# Patient Record
Sex: Female | Born: 1980 | Hispanic: No | Marital: Married | Smoking: Never smoker
Health system: Southern US, Community
[De-identification: ages and names within clinical notes are randomized; demographics above are authoritative.]

## PROBLEM LIST (undated history)

## (undated) ENCOUNTER — Ambulatory Visit (HOSPITAL_COMMUNITY): Discharge status: Absent without leave | Payer: BC Managed Care – PPO

## (undated) DIAGNOSIS — B009 Herpesviral infection, unspecified: Secondary | ICD-10-CM

## (undated) HISTORY — PX: WISDOM TOOTH EXTRACTION: SHX21

---

## 2006-02-28 ENCOUNTER — Other Ambulatory Visit: Admission: RE | Admit: 2006-02-28 | Discharge: 2006-02-28 | Payer: Self-pay | Admitting: Family Medicine

## 2007-05-09 ENCOUNTER — Other Ambulatory Visit: Admission: RE | Admit: 2007-05-09 | Discharge: 2007-05-09 | Payer: Self-pay | Admitting: Family Medicine

## 2008-01-16 ENCOUNTER — Other Ambulatory Visit: Admission: RE | Admit: 2008-01-16 | Discharge: 2008-01-16 | Payer: Self-pay | Admitting: Obstetrics and Gynecology

## 2008-05-22 ENCOUNTER — Other Ambulatory Visit: Admission: RE | Admit: 2008-05-22 | Discharge: 2008-05-22 | Payer: Self-pay | Admitting: Obstetrics and Gynecology

## 2009-05-24 ENCOUNTER — Other Ambulatory Visit: Admission: RE | Admit: 2009-05-24 | Discharge: 2009-05-24 | Payer: Self-pay | Admitting: Obstetrics and Gynecology

## 2009-12-09 ENCOUNTER — Other Ambulatory Visit: Admission: RE | Admit: 2009-12-09 | Discharge: 2009-12-09 | Payer: Self-pay | Admitting: Obstetrics and Gynecology

## 2010-03-07 ENCOUNTER — Other Ambulatory Visit: Admission: RE | Admit: 2010-03-07 | Discharge: 2010-03-07 | Payer: Self-pay | Admitting: Obstetrics and Gynecology

## 2010-09-06 ENCOUNTER — Other Ambulatory Visit: Admission: RE | Admit: 2010-09-06 | Discharge: 2010-09-06 | Payer: Self-pay | Admitting: Obstetrics and Gynecology

## 2011-09-19 ENCOUNTER — Other Ambulatory Visit (HOSPITAL_COMMUNITY)
Admission: RE | Admit: 2011-09-19 | Discharge: 2011-09-19 | Disposition: A | Discharge status: Home / Self Care | Payer: BC Managed Care – PPO | Source: Ambulatory Visit | Attending: Obstetrics and Gynecology | Admitting: Obstetrics and Gynecology

## 2011-09-19 ENCOUNTER — Other Ambulatory Visit: Payer: Self-pay | Admitting: Nurse Practitioner

## 2011-09-19 DIAGNOSIS — Z113 Encounter for screening for infections with a predominantly sexual mode of transmission: Secondary | ICD-10-CM | POA: Insufficient documentation

## 2011-09-19 DIAGNOSIS — Z01419 Encounter for gynecological examination (general) (routine) without abnormal findings: Secondary | ICD-10-CM | POA: Insufficient documentation

## 2012-09-19 ENCOUNTER — Other Ambulatory Visit: Payer: Self-pay | Admitting: Nurse Practitioner

## 2012-09-19 ENCOUNTER — Other Ambulatory Visit (HOSPITAL_COMMUNITY)
Admission: RE | Admit: 2012-09-19 | Discharge: 2012-09-19 | Disposition: A | Discharge status: Home / Self Care | Payer: BC Managed Care – PPO | Source: Ambulatory Visit | Attending: Obstetrics and Gynecology | Admitting: Obstetrics and Gynecology

## 2012-09-19 DIAGNOSIS — Z113 Encounter for screening for infections with a predominantly sexual mode of transmission: Secondary | ICD-10-CM | POA: Insufficient documentation

## 2012-09-19 DIAGNOSIS — Z01419 Encounter for gynecological examination (general) (routine) without abnormal findings: Secondary | ICD-10-CM | POA: Insufficient documentation

## 2013-09-25 ENCOUNTER — Other Ambulatory Visit: Payer: Self-pay | Admitting: Nurse Practitioner

## 2013-09-25 ENCOUNTER — Other Ambulatory Visit (HOSPITAL_COMMUNITY)
Admission: RE | Admit: 2013-09-25 | Discharge: 2013-09-25 | Disposition: A | Discharge status: Home / Self Care | Payer: BC Managed Care – PPO | Source: Ambulatory Visit | Attending: Nurse Practitioner | Admitting: Nurse Practitioner

## 2013-09-25 DIAGNOSIS — Z1151 Encounter for screening for human papillomavirus (HPV): Secondary | ICD-10-CM | POA: Insufficient documentation

## 2013-09-25 DIAGNOSIS — R87810 Cervical high risk human papillomavirus (HPV) DNA test positive: Secondary | ICD-10-CM | POA: Insufficient documentation

## 2013-09-25 DIAGNOSIS — Z01419 Encounter for gynecological examination (general) (routine) without abnormal findings: Secondary | ICD-10-CM | POA: Insufficient documentation

## 2014-03-26 ENCOUNTER — Other Ambulatory Visit: Payer: Self-pay | Admitting: Nurse Practitioner

## 2014-03-26 ENCOUNTER — Other Ambulatory Visit (HOSPITAL_COMMUNITY)
Admission: RE | Admit: 2014-03-26 | Discharge: 2014-03-26 | Disposition: A | Discharge status: Home / Self Care | Payer: BC Managed Care – PPO | Source: Ambulatory Visit | Attending: Obstetrics and Gynecology | Admitting: Obstetrics and Gynecology

## 2014-03-26 DIAGNOSIS — Z01419 Encounter for gynecological examination (general) (routine) without abnormal findings: Secondary | ICD-10-CM | POA: Insufficient documentation

## 2014-04-27 ENCOUNTER — Encounter: Payer: Self-pay | Admitting: Family Medicine

## 2014-09-24 ENCOUNTER — Other Ambulatory Visit: Payer: Self-pay | Admitting: Nurse Practitioner

## 2014-09-24 ENCOUNTER — Other Ambulatory Visit (HOSPITAL_COMMUNITY)
Admission: RE | Admit: 2014-09-24 | Discharge: 2014-09-24 | Disposition: A | Discharge status: Home / Self Care | Payer: BC Managed Care – PPO | Source: Ambulatory Visit | Attending: Obstetrics and Gynecology | Admitting: Obstetrics and Gynecology

## 2014-09-24 DIAGNOSIS — Z01419 Encounter for gynecological examination (general) (routine) without abnormal findings: Secondary | ICD-10-CM | POA: Diagnosis present

## 2014-09-25 LAB — CYTOLOGY - PAP

## 2014-11-27 NOTE — L&D Delivery Note (Signed)
Delivery Note At 5:46 PM a viable female was delivered via Vaginal, Spontaneous Delivery (Presentation: Middle Occiput Anterior).  APGAR: 8, 9; weight  .   Placenta status: Intact, Spontaneous.  Cord: 3 vessels with the following complications: None.  Cord pH: NA  Anesthesia: Epidural  Episiotomy: None Lacerations: 2nd degree;Perineal Suture Repair: 3.0 vicryl Est. Blood Loss (mL): 400  Mom to postpartum.  Baby to Couplet care / Skin to Skin.  Nykeem Citro J. 11/20/2015, 6:57 PM

## 2015-01-05 ENCOUNTER — Ambulatory Visit (INDEPENDENT_AMBULATORY_CARE_PROVIDER_SITE_OTHER): Payer: BC Managed Care – PPO

## 2015-01-05 ENCOUNTER — Ambulatory Visit (INDEPENDENT_AMBULATORY_CARE_PROVIDER_SITE_OTHER): Payer: BC Managed Care – PPO | Admitting: Podiatry

## 2015-01-05 ENCOUNTER — Encounter: Payer: Self-pay | Admitting: Podiatry

## 2015-01-05 VITALS — BP 104/64 | HR 87 | Resp 16 | Ht 62.0 in | Wt 137.0 lb

## 2015-01-05 DIAGNOSIS — M2012 Hallux valgus (acquired), left foot: Secondary | ICD-10-CM

## 2015-01-05 DIAGNOSIS — M21612 Bunion of left foot: Secondary | ICD-10-CM

## 2015-01-05 NOTE — Progress Notes (Signed)
   Subjective:    Patient ID: Caroline Payne, female    DOB: 08/30/1981, 34 y.o.   MRN: [id number]  HPI Comments: Left foot i think i have a bunion . Felt it more after wearing high heels, it has got a little worse. The pain has subsided some ,but i have paid more attention to the shoes i wear      Review of Systems  All other systems reviewed and are negative.      Objective:   Physical Exam: I have reviewed his past medical history medications allergy surgery social history and review of systems. Pulses are strongly palpable bilateral. Neurologic sensorium is intact for Semmes-Weinstein monofilament. Deep tendon reflexes intact bilateral and muscle strength was 5 over 5 dorsiflexion splint flexors and everters and inverters all intrinsic musculature is intact. Orthopedic evaluation of his result joints distal to the ankle for range of motion without crepitus with exception of the first metatarsophalangeal joint of the left foot which somewhat limited on dorsiflexion. Radiographic evaluation of the bilateral foot does demonstrate an increase in the first intermetatarsal angle left over right and an increase in hallux adductus angle greater than normal values. Just has a hammertoe deformity left.        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  Plan: Discussed in great detail today the need for surgical intervention. She understands that this is more than likely an inherited condition particularly since it is unilateral.

## 2015-11-19 ENCOUNTER — Other Ambulatory Visit: Payer: Self-pay | Admitting: Obstetrics & Gynecology

## 2015-11-20 ENCOUNTER — Inpatient Hospital Stay (HOSPITAL_COMMUNITY)
Admission: AD | Admit: 2015-11-20 | Discharge: 2015-11-22 | DRG: 774 | Disposition: A | Discharge status: Home / Self Care | Payer: BC Managed Care – PPO | Source: Ambulatory Visit | Attending: Obstetrics and Gynecology | Admitting: Obstetrics and Gynecology

## 2015-11-20 ENCOUNTER — Inpatient Hospital Stay (HOSPITAL_COMMUNITY): Payer: BC Managed Care – PPO | Admitting: Anesthesiology

## 2015-11-20 ENCOUNTER — Encounter (HOSPITAL_COMMUNITY): Payer: Self-pay

## 2015-11-20 DIAGNOSIS — Z6831 Body mass index (BMI) 31.0-31.9, adult: Secondary | ICD-10-CM

## 2015-11-20 DIAGNOSIS — E669 Obesity, unspecified: Secondary | ICD-10-CM | POA: Diagnosis present

## 2015-11-20 DIAGNOSIS — B009 Herpesviral infection, unspecified: Secondary | ICD-10-CM | POA: Diagnosis present

## 2015-11-20 DIAGNOSIS — O9852 Other viral diseases complicating childbirth: Secondary | ICD-10-CM | POA: Diagnosis present

## 2015-11-20 DIAGNOSIS — O99214 Obesity complicating childbirth: Secondary | ICD-10-CM | POA: Diagnosis present

## 2015-11-20 DIAGNOSIS — IMO0001 Reserved for inherently not codable concepts without codable children: Secondary | ICD-10-CM

## 2015-11-20 DIAGNOSIS — Z3A39 39 weeks gestation of pregnancy: Secondary | ICD-10-CM | POA: Diagnosis not present

## 2015-11-20 HISTORY — DX: Herpesviral infection, unspecified: B00.9

## 2015-11-20 LAB — ABO/RH: ABO/RH(D): O POS

## 2015-11-20 LAB — OB RESULTS CONSOLE RPR: RPR: NONREACTIVE

## 2015-11-20 LAB — OB RESULTS CONSOLE GBS: STREP GROUP B AG: NEGATIVE

## 2015-11-20 LAB — CBC
HEMATOCRIT: 38.2 % (ref 36.0–46.0)
Hemoglobin: 13.4 g/dL (ref 12.0–15.0)
MCH: 32.2 pg (ref 26.0–34.0)
MCHC: 35.1 g/dL (ref 30.0–36.0)
MCV: 91.8 fL (ref 78.0–100.0)
PLATELETS: 199 10*3/uL (ref 150–400)
RBC: 4.16 MIL/uL (ref 3.87–5.11)
RDW: 12.9 % (ref 11.5–15.5)
WBC: 18.7 10*3/uL — AB (ref 4.0–10.5)

## 2015-11-20 LAB — OB RESULTS CONSOLE GC/CHLAMYDIA
Chlamydia: NEGATIVE
Gonorrhea: NEGATIVE

## 2015-11-20 LAB — TYPE AND SCREEN
ABO/RH(D): O POS
Antibody Screen: NEGATIVE

## 2015-11-20 LAB — OB RESULTS CONSOLE HIV ANTIBODY (ROUTINE TESTING): HIV: NONREACTIVE

## 2015-11-20 MED ORDER — OXYTOCIN 40 UNITS IN LACTATED RINGERS INFUSION - SIMPLE MED
1.0000 m[IU]/min | INTRAVENOUS | Status: DC
  Administered 2015-11-20: 2 m[IU]/min via INTRAVENOUS
  Filled 2015-11-20: qty 1000

## 2015-11-20 MED ORDER — BENZOCAINE-MENTHOL 20-0.5 % EX AERO
1.0000 "application " | INHALATION_SPRAY | CUTANEOUS | Status: DC | PRN
  Administered 2015-11-20 – 2015-11-22 (×2): 1 via TOPICAL
  Filled 2015-11-20 (×2): qty 56

## 2015-11-20 MED ORDER — ONDANSETRON HCL 4 MG/2ML IJ SOLN: 4.0000 mg | INTRAMUSCULAR | Status: DC | PRN

## 2015-11-20 MED ORDER — LANOLIN HYDROUS EX OINT: TOPICAL_OINTMENT | CUTANEOUS | Status: DC | PRN

## 2015-11-20 MED ORDER — ONDANSETRON HCL 4 MG/2ML IJ SOLN
4.0000 mg | Freq: Four times a day (QID) | INTRAMUSCULAR | Status: DC | PRN
  Filled 2015-11-20: qty 2

## 2015-11-20 MED ORDER — OXYTOCIN 40 UNITS IN LACTATED RINGERS INFUSION - SIMPLE MED
62.5000 mL/h | INTRAVENOUS | Status: DC
  Administered 2015-11-20: 62.5 mL/h via INTRAVENOUS

## 2015-11-20 MED ORDER — FENTANYL 2.5 MCG/ML BUPIVACAINE 1/10 % EPIDURAL INFUSION (WH - ANES)
14.0000 mL/h | INTRAMUSCULAR | Status: DC | PRN
  Administered 2015-11-20: 14 mL/h via EPIDURAL
  Filled 2015-11-20: qty 125

## 2015-11-20 MED ORDER — METHYLERGONOVINE MALEATE 0.2 MG PO TABS: 0.2000 mg | ORAL_TABLET | ORAL | Status: DC | PRN

## 2015-11-20 MED ORDER — ZOLPIDEM TARTRATE 5 MG PO TABS: 5.0000 mg | ORAL_TABLET | Freq: Every evening | ORAL | Status: DC | PRN

## 2015-11-20 MED ORDER — FLEET ENEMA 7-19 GM/118ML RE ENEM
1.0000 | ENEMA | RECTAL | Status: DC | PRN
Start: 2015-11-20 — End: 2015-11-20

## 2015-11-20 MED ORDER — PRENATAL MULTIVITAMIN CH
1.0000 | ORAL_TABLET | Freq: Every day | ORAL | Status: DC
  Administered 2015-11-21: 1 via ORAL
  Filled 2015-11-20: qty 1

## 2015-11-20 MED ORDER — CITRIC ACID-SODIUM CITRATE 334-500 MG/5ML PO SOLN: 30.0000 mL | ORAL | Status: DC | PRN

## 2015-11-20 MED ORDER — PHENYLEPHRINE 40 MCG/ML (10ML) SYRINGE FOR IV PUSH (FOR BLOOD PRESSURE SUPPORT)
80.0000 ug | PREFILLED_SYRINGE | INTRAVENOUS | Status: DC | PRN
  Filled 2015-11-20: qty 20
  Filled 2015-11-20: qty 2

## 2015-11-20 MED ORDER — OXYTOCIN BOLUS FROM INFUSION
500.0000 mL | INTRAVENOUS | Status: DC
  Administered 2015-11-20: 500 mL via INTRAVENOUS

## 2015-11-20 MED ORDER — SIMETHICONE 80 MG PO CHEW: 80.0000 mg | CHEWABLE_TABLET | ORAL | Status: DC | PRN

## 2015-11-20 MED ORDER — TERBUTALINE SULFATE 1 MG/ML IJ SOLN
0.2500 mg | Freq: Once | INTRAMUSCULAR | Status: DC | PRN
  Filled 2015-11-20: qty 1

## 2015-11-20 MED ORDER — OXYCODONE-ACETAMINOPHEN 5-325 MG PO TABS: 2.0000 | ORAL_TABLET | ORAL | Status: DC | PRN

## 2015-11-20 MED ORDER — SENNOSIDES-DOCUSATE SODIUM 8.6-50 MG PO TABS
2.0000 | ORAL_TABLET | ORAL | Status: DC
  Administered 2015-11-21: 2 via ORAL
  Filled 2015-11-20 (×2): qty 2

## 2015-11-20 MED ORDER — DIPHENHYDRAMINE HCL 50 MG/ML IJ SOLN: 12.5000 mg | INTRAMUSCULAR | Status: DC | PRN

## 2015-11-20 MED ORDER — LACTATED RINGERS IV SOLN
500.0000 mL | INTRAVENOUS | Status: DC | PRN
  Administered 2015-11-20 (×2): 500 mL via INTRAVENOUS

## 2015-11-20 MED ORDER — LIDOCAINE HCL (PF) 1 % IJ SOLN
30.0000 mL | INTRAMUSCULAR | Status: DC | PRN
  Filled 2015-11-20: qty 30

## 2015-11-20 MED ORDER — EPHEDRINE 5 MG/ML INJ
10.0000 mg | INTRAVENOUS | Status: DC | PRN
  Filled 2015-11-20: qty 2

## 2015-11-20 MED ORDER — LACTATED RINGERS IV SOLN
INTRAVENOUS | Status: DC
  Administered 2015-11-20 (×4): via INTRAVENOUS

## 2015-11-20 MED ORDER — OXYCODONE-ACETAMINOPHEN 5-325 MG PO TABS: 1.0000 | ORAL_TABLET | ORAL | Status: DC | PRN

## 2015-11-20 MED ORDER — ACETAMINOPHEN 325 MG PO TABS: 650.0000 mg | ORAL_TABLET | ORAL | Status: DC | PRN

## 2015-11-20 MED ORDER — METHYLERGONOVINE MALEATE 0.2 MG/ML IJ SOLN: 0.2000 mg | INTRAMUSCULAR | Status: DC | PRN

## 2015-11-20 MED ORDER — ONDANSETRON HCL 4 MG PO TABS: 4.0000 mg | ORAL_TABLET | ORAL | Status: DC | PRN

## 2015-11-20 MED ORDER — LIDOCAINE HCL (PF) 1 % IJ SOLN
INTRAMUSCULAR | Status: DC | PRN
  Administered 2015-11-20: 3 mL via EPIDURAL
  Administered 2015-11-20: 5 mL via EPIDURAL
  Administered 2015-11-20: 2 mL via EPIDURAL

## 2015-11-20 MED ORDER — DIBUCAINE 1 % RE OINT: 1.0000 "application " | TOPICAL_OINTMENT | RECTAL | Status: DC | PRN

## 2015-11-20 MED ORDER — IBUPROFEN 600 MG PO TABS
600.0000 mg | ORAL_TABLET | Freq: Four times a day (QID) | ORAL | Status: DC
  Administered 2015-11-21 – 2015-11-22 (×5): 600 mg via ORAL
  Filled 2015-11-20 (×6): qty 1

## 2015-11-20 MED ORDER — WITCH HAZEL-GLYCERIN EX PADS
1.0000 | MEDICATED_PAD | CUTANEOUS | Status: DC | PRN
Start: 2015-11-20 — End: 2015-11-22

## 2015-11-20 MED ORDER — DIPHENHYDRAMINE HCL 25 MG PO CAPS: 25.0000 mg | ORAL_CAPSULE | Freq: Four times a day (QID) | ORAL | Status: DC | PRN

## 2015-11-20 MED ORDER — FERROUS SULFATE 325 (65 FE) MG PO TABS
325.0000 mg | ORAL_TABLET | Freq: Two times a day (BID) | ORAL | Status: DC
  Administered 2015-11-22: 325 mg via ORAL
  Filled 2015-11-20: qty 1

## 2015-11-20 NOTE — H&P (Signed)
Caroline Payne is a 34 y.o. female  G1P0 at 50 wks and 6 days with EDD 11/21/2015 presents complaining of regular contractions. Every 2-4 minutes. She was 2 cm on arrival to MAU. She ambulated for an hour and progressed to 3.5 cm. + FM  Patients thinks her membranes ruptured at 830 am.  No vaginal bleeding. 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 She has been on valtrex. She denies any active lesions. She recieves Prenatal care with DR. Ozan at [facility].     History OB History    Gravida Para Term Preterm AB TAB SAB Ectopic Multiple Living   1              Past Medical History  Diagnosis Date  . HSV infection    Past Surgical History  Procedure Laterality Date  . Wisdom tooth extraction     Family History: family history is not on file. Social History:  reports that she has never smoked. She has never used smokeless tobacco. She reports that she does not drink alcohol or use illicit drugs.   Prenatal Transfer Tool  Maternal Diabetes: No Genetic Screening: Normal Maternal Ultrasounds/Referrals: Normal Fetal Ultrasounds or other Referrals:  None Maternal Substance Abuse:  No Significant Maternal Medications:  None Significant Maternal Lab Results:  Lab values include: Group B Strep negative Other Comments:  None  Review of Systems  Constitutional: Negative.   HENT: Negative.   Eyes: Negative.   Respiratory: Negative.   Cardiovascular: Negative.   Gastrointestinal: Negative.   Genitourinary: Negative.   Musculoskeletal: Negative.   Skin: Negative.   Neurological: Negative.   Endo/Heme/Allergies: Negative.   Psychiatric/Behavioral: Negative.     Dilation: 3.5 Effacement (%): 100 Station: -2 Exam by:: [employer] Blood pressure 176/150, pulse 86, temperature 98.5 F (36.9 C), temperature source Oral, resp. rate 20, height  (1.575 m), weight 173 lb (78.472 kg), SpO2 100 %. Maternal Exam:  Uterine Assessment: Contraction strength is moderate.   Contraction duration is 60 seconds. Contraction frequency is regular.   Abdomen: Patient reports no abdominal tenderness. Fetal presentation: vertex  Introitus: Normal vulva. Normal vagina.    Fetal Exam Fetal Monitor Review: Baseline rate: 120.  Variability: moderate (6-25 bpm).   Pattern: accelerations present and no decelerations.    Fetal State Assessment: Category I - tracings are normal.     Physical Exam  Vitals reviewed. Constitutional: She is oriented to person, place, and time. She appears well-developed and well-nourished.  HENT:  Head: Normocephalic and atraumatic.  Eyes: Pupils are equal, round, and reactive to light.  Neck: Normal range of motion.  Cardiovascular: Normal rate and regular rhythm.   Respiratory: Effort normal and breath sounds normal.  GI: Bowel sounds are normal. There is no tenderness. There is no rebound.  Genitourinary: Vagina normal.  Musculoskeletal: Normal range of motion. She exhibits no edema.  Neurological: She is alert and oriented to person, place, and time. She has normal reflexes.  Skin: Skin is warm and dry.  Psychiatric: She has a normal mood and affect.   Cervix 3.5 cm / 100/ -2 station... IUPC placed. Forebag broken  Fluid appears clear   Prenatal labs: ABO, Rh: --/--/O POS (12/24 0745) Antibody: NEG (12/24 0745) Rubella:  Immune RPR:   Nonreactive  HBsAg:   Negative  HIV:   Non-reactive  GBS:   Negative   Assessment/Plan: 39 wks and 6 days in Labor.  Admit to birthing suites  GBS negative  HSV infection... No  active lesions  Anticipate SVD   Shawndale Kilpatrick J. 11/20/2015, 9:43 AM

## 2015-11-20 NOTE — Anesthesia Procedure Notes (Signed)
Epidural Patient location during procedure: OB  Staffing Anesthesiologist: Parminder Trapani EDWARD Performed by: anesthesiologist   Preanesthetic Checklist Completed: patient identified, pre-op evaluation, timeout performed, IV checked, risks and benefits discussed and monitors and equipment checked  Epidural Patient position: sitting Prep: DuraPrep Patient monitoring: blood pressure and continuous pulse ox Approach: midline Location: L3-L4 Injection technique: LOR air  Needle:  Needle type: Tuohy  Needle gauge: 17 G Needle length: 9 cm Needle insertion depth: 5 cm Catheter size: 19 Gauge Catheter at skin depth: 10 cm Test dose: negative and Other (1% Lidocaine)  Additional Notes Patient identified.  Risk benefits discussed including failed block, incomplete pain control, headache, nerve damage, paralysis, blood pressure changes, nausea, vomiting, reactions to medication both toxic or allergic, and postpartum back pain.  Patient expressed understanding and wished to proceed.  All questions were answered.  Sterile technique used throughout procedure and epidural site dressed with sterile barrier dressing. No paresthesia or other complications noted. The patient did not experience any signs of intravascular injection such as tinnitus or metallic taste in mouth nor signs of intrathecal spread such as rapid motor block. Please see nursing notes for vital signs. Reason for block:procedure for pain   

## 2015-11-20 NOTE — MAU Note (Addendum)
Sweeped membranes in office 12/23, was 1 cm.  Spotting earlier.  Mucus. No leaking.  Baby moving well. Ctxs since 10 pm.

## 2015-11-20 NOTE — Progress Notes (Signed)
Caroline LangeChristina Payne is a 34 y.o. G1P0 at 2029w6d admitted for active labor  Subjective:  Patient is comfortable with her epidural. No complaints. Feeling more pressure  Objective: BP 105/58 mmHg  Pulse 96  Temp(Src) 97.8 F (36.6 C) (Oral)  Resp 18  Ht 5\' 2"  (1.575 m)  Wt 173 lb (78.472 kg)  BMI 31.63 kg/m2  SpO2 98%   Total I/O In: -  Out: 600 [Urine:600]  FHT:  FHR: 130 bpm, variability: moderate,  accelerations:  Present,  decelerations:  Present 1 isolated variable  to 90 resolved with repositioning.Marland Kitchen. overall UC:   regular, every 2-3 minutes SVE:  8/100-1 station  Labs: Lab Results  Component Value Date   WBC 18.7* 11/20/2015   HGB 13.4 11/20/2015   HCT 38.2 11/20/2015   MCV 91.8 11/20/2015   PLT 199 11/20/2015    Assessment / Plan: Augmentation of labor, progressing well  Labor: progressing on pitocin  Preeclampsia:  NA Fetal Wellbeing:  Category I Pain Control:  Epidural I/D:  n/a Anticipated MOD:  NSVD  Caroline Payne J. 11/20/2015, 2:32 PM

## 2015-11-20 NOTE — Lactation Note (Signed)
This note was copied from the chart of Caroline Sallee LangeChristina Donica. Lactation Consultation Note  Patient Name: Caroline Payne ZOXWR'UToday's Date: 11/20/2015 Reason for consult: Initial assessment Baby at 5 hr of life and mom reports feeding are going well. FOB was sts with baby, visitors present, mom refused latch help at this time. Encouraged her to call out if she had any issues. Answered questions about pumping and formula use. Discussed baby behavior, feeding frequency, baby belly size, voids, wt loss, breast changes, and nipple care. Mom stated that she can manually express and has seen colostrum bilaterally. Given lactation handouts. Aware of OP services and support group.    Maternal Data Has patient been taught Hand Expression?: Yes Does the patient have breastfeeding experience prior to this delivery?: No  Feeding Feeding Type: Breast Fed Length of feed: 10 min  LATCH Score/Interventions                      Lactation Tools Discussed/Used     Consult Status Consult Status: Follow-up Date: 11/21/15 Follow-up type: In-patient    Rulon Eisenmengerlizabeth E Kamarri Fischetti 11/20/2015, 11:01 PM

## 2015-11-20 NOTE — Anesthesia Preprocedure Evaluation (Signed)
Anesthesia Evaluation  Patient identified by MRN, date of birth, ID band Patient awake    Reviewed: Allergy & Precautions, NPO status , Patient's Chart, lab work & pertinent test results  Airway Mallampati: III  TM Distance: >3 FB Neck ROM: Full    Dental  (+) Teeth Intact, Dental Advisory Given   Pulmonary neg pulmonary ROS,    Pulmonary exam normal breath sounds clear to auscultation       Cardiovascular Exercise Tolerance: Good negative cardio ROS Normal cardiovascular exam Rhythm:Regular Rate:Normal     Neuro/Psych negative neurological ROS  negative psych ROS   GI/Hepatic negative GI ROS, Neg liver ROS,   Endo/Other  Obesity   Renal/GU negative Renal ROS     Musculoskeletal negative musculoskeletal ROS (+)   Abdominal   Peds  Hematology negative hematology ROS (+) Plt 199k   Anesthesia Other Findings Day of surgery medications reviewed with the patient.  Reproductive/Obstetrics (+) Pregnancy                             Anesthesia Physical Anesthesia Plan  ASA: II  Anesthesia Plan: Epidural   Post-op Pain Management:    Induction:   Airway Management Planned:   Additional Equipment:   Intra-op Plan:   Post-operative Plan:   Informed Consent: I have reviewed the patients History and Physical, chart, labs and discussed the procedure including the risks, benefits and alternatives for the proposed anesthesia with the patient or authorized representative who has indicated his/her understanding and acceptance.   Dental advisory given  Plan Discussed with:   Anesthesia Plan Comments: (Patient identified. Risks/Benefits/Options discussed with patient including but not limited to bleeding, infection, nerve damage, paralysis, failed block, incomplete pain control, headache, blood pressure changes, nausea, vomiting, reactions to medication both or allergic, itching and  postpartum back pain. Confirmed with bedside nurse the patient's most recent platelet count. Confirmed with patient that they are not currently taking any anticoagulation, have any bleeding history or any family history of bleeding disorders. Patient expressed understanding and wished to proceed. All questions were answered. )        Anesthesia Quick Evaluation

## 2015-11-21 DIAGNOSIS — IMO0001 Reserved for inherently not codable concepts without codable children: Secondary | ICD-10-CM

## 2015-11-21 LAB — CBC
HCT: 32.1 % — ABNORMAL LOW (ref 36.0–46.0)
HEMOGLOBIN: 11.2 g/dL — AB (ref 12.0–15.0)
MCH: 32.1 pg (ref 26.0–34.0)
MCHC: 34.9 g/dL (ref 30.0–36.0)
MCV: 92 fL (ref 78.0–100.0)
Platelets: 183 10*3/uL (ref 150–400)
RBC: 3.49 MIL/uL — AB (ref 3.87–5.11)
RDW: 13.5 % (ref 11.5–15.5)
WBC: 24.1 10*3/uL — ABNORMAL HIGH (ref 4.0–10.5)

## 2015-11-21 LAB — RPR: RPR: NONREACTIVE

## 2015-11-21 MED ORDER — IBUPROFEN 600 MG PO TABS: 600.0000 mg | ORAL_TABLET | Freq: Four times a day (QID) | ORAL | Status: DC | PRN

## 2015-11-21 NOTE — Lactation Note (Signed)
This note was copied from the chart of Caroline Sallee LangeChristina Lagace. Lactation Consultation Note  Patient Name: Caroline Payne RUEAV'WToday's Date: 11/21/2015 Reason for consult: Follow-up assessment;Difficult latch Visited with Mom, baby at 3821 hrs old and hasn't been able to sustain a deep latch.  Baby circ'd this am and has been swaddled and sleeping.  Offered to assist with trying to latch baby or place him skin to skin.  Baby easily awakened when undressed.  Sucking on finger, tongue noted to be pushing finger out of mouth.  Unable to see a frenulum, but did feel one on finger swipe under tongue.  Set baby up skin to skin in football hold on right side.  Assisted Mom to manually express her breast, small dot of colostrum expressed.  Breasts soft, and areola slightly full, but are compressible.  Demonstrated hand placement and how to elicit baby to open his mouth.  Mom trying to force baby onto breast without his mouth opening.  Baby did open a couple times, but was unable to sustain a deep areolar grasp.  Baby suckling on nipple only, a couple times before falling asleep.  Mom denied feeling discomfort.  Recommended she use manual breast expression, massage and use of DEBP for 15 mins each time baby is unable to latch deeply.  Talked about possibly using a nipple shield when baby becomes more hungry.  Talked about using expressed colostrum by syringe and/or Alimentum formula to supplement by cup/bottle.  Mom agreeable to supplementing, but she is teary eyed.  Reassurance given that we would work with her to reach her breastfeeding goals. To follow up prn, and in am.     Consult Status Consult Status: Follow-up Date: 11/22/15 Follow-up type: In-patient    Judee ClaraSmith, Ronney Honeywell E 11/21/2015, 3:25 PM

## 2015-11-21 NOTE — Progress Notes (Signed)
Post Partum Day 1 Subjective: no complaints, up ad lib, voiding and tolerating PO  Objective: Blood pressure 105/74, pulse 88, temperature 98.4 F (36.9 C), temperature source Oral, resp. rate 20, height 5\' 2"  (1.575 m), weight 173 lb (78.472 kg), SpO2 100 %, unknown if currently breastfeeding.  Physical Exam:  General: alert and cooperative Lochia: appropriate Uterine Fundus: firm Incision: NA DVT Evaluation: No evidence of DVT seen on physical exam.   Recent Labs  11/20/15 0745 11/21/15 0535  HGB 13.4 11.2*  HCT 38.2 32.1*    Assessment/Plan: Plan for discharge tomorrow, Breastfeeding and Circumcision prior to discharge  Routine postpartum care    LOS: 1 day   Faye Strohman J. 11/21/2015, 11:41 AM

## 2015-11-21 NOTE — Lactation Note (Signed)
This note was copied from the chart of Caroline Sallee LangeChristina Burrous. Lactation Consultation Note  Set up DEBP for stimulation.  Reviewed cleaning and milk storage. Mother was able to pump some drops of colostrum. Reviewed how to spoon feed, cup feed and syringe feed. Suggest mother post pump 4-6 times a day if baby until baby is more successful at breastfeeding. Left LC phone number and suggest parents call to help with next feeding. Baby has been sleepy due to circ.  [facility].  Clydie BraunKaren RN assisting w/ hand expression upon entering and then helped latch. Baby came off breast after a few minutes of feeding.  Sucks and swallows observed. Assisted w/ relatching in football hold on right breast.  Baby tongue thrusts and has short lingual frenulum.  Advised parents to speak w/ Ped MD. Demonstrated to do suck training. Mother needed help with positioning and praised her for her and dad's efforts. Helped w/ repositioning in cross cradle and football on left breast. Mother had recently pumped approx 5 ml of colostrum.   Encouraged her to breastfeed on both breasts per feeding, flange lips and massage breast.  Demonstrated how to finger syringe feed colostrum to baby.   Baby does not keep suction during entire finger feed.  Suggest mother continue to post pump at least one more time tonight.    Patient Name: Caroline Payne XBJYN'WToday's Date: 11/21/2015 Reason for consult: Follow-up assessment;Difficult latch   Maternal Data    Feeding Feeding Type: Breast Fed Length of feed: 15 min (on and off)  LATCH Score/Interventions Latch: Repeated attempts needed to sustain latch, nipple held in mouth throughout feeding, stimulation needed to elicit sucking reflex. Intervention(s): Skin to skin;Teach feeding cues;Waking techniques Intervention(s): Breast compression;Breast massage;Assist with latch;Adjust position  Audible Swallowing: None Intervention(s): Hand expression;Skin to  skin Intervention(s): Skin to skin;Hand expression;Alternate breast massage  Type of Nipple: Everted at rest and after stimulation  Comfort (Breast/Nipple): Soft / non-tender     Hold (Positioning): Assistance needed to correctly position infant at breast and maintain latch. Intervention(s): Breastfeeding basics reviewed;Support Pillows;Position options;Skin to skin  LATCH Score: 6  Lactation Tools Discussed/Used Tools: Pump Breast pump type: Double-Electric Breast Pump Pump Review: Setup, frequency, and cleaning;Milk Storage Initiated by:: Shaune Pollack. Berklehammer RN IBCLC Date initiated:: 11/21/15   Consult Status Consult Status: Follow-up Date: 11/22/15 Follow-up type: In-patient    Dahlia ByesBerkelhammer, Nahiara Kretzschmar [facility] 11/21/2015, 4:03 PM

## 2015-11-21 NOTE — Anesthesia Postprocedure Evaluation (Signed)
Anesthesia Post Note  Patient: Caroline Payne  Procedure(s) Performed: * No procedures listed *  Patient location during evaluation: Mother Baby Anesthesia Type: Epidural Level of consciousness: awake Pain management: satisfactory to patient Vital Signs Assessment: post-procedure vital signs reviewed and stable Respiratory status: spontaneous breathing Cardiovascular status: stable Anesthetic complications: no    Last Vitals:  Filed Vitals:   11/21/15 0531 11/21/15 0820  BP: 109/68 105/74  Pulse: 84 88  Temp: 36.5 C 36.9 C  Resp: 18 20    Last Pain:  Filed Vitals:   11/21/15 0821  PainSc: 2                  Wilmina Maxham

## 2015-11-22 NOTE — Lactation Note (Signed)
This note was copied from the chart of Caroline Payne Tenorio. Lactation Consultation Note  Follow up visit made prior to discharge.  Mom states most feedings are going better.  Sometimes the baby goes too long between feeds and becomes frantic making the latch more difficult.  Discussed watching for early feeding cues and allowing baby to suck on a clean finger if frantic.  Discharge instructions given including engorgement treatment.  Outpatient lactation services and support information reviewed and encouraged.  Patient Name: Caroline Payne Grega JXBJY'NToday's Date: 11/22/2015     Maternal Data    Feeding Feeding Type: Breast Fed Length of feed: 10 min  LATCH Score/Interventions Latch: Grasps breast easily, tongue down, lips flanged, rhythmical sucking.  Audible Swallowing: A few with stimulation  Type of Nipple: Everted at rest and after stimulation  Comfort (Breast/Nipple): Soft / non-tender     Hold (Positioning): Assistance needed to correctly position infant at breast and maintain latch.  LATCH Score: 8  Lactation Tools Discussed/Used     Consult Status      Huston FoleyMOULDEN, Zaheer Wageman S 11/22/2015, 10:06 AM

## 2015-11-22 NOTE — Discharge Summary (Signed)
OB Discharge Summary     Patient Name: Caroline Payne DOB: 08/08/1981 MRN: [id number]  Date of admission: 11/20/2015 Delivering MD: Gerald LeitzOLE, Fatoumata Albaugh   Date of discharge: 11/22/2015  Admitting diagnosis: 40wks, CTX Active labor  Intrauterine pregnancy: 2933w6d     Secondary diagnosis:  Active Problems:   Indication for care in labor or delivery   Active labor at term  Additional problems: None     Discharge diagnosis: Term Pregnancy Delivered                                                                                                Post partum procedures:None  Augmentation: Pitocin  Complications: None  Hospital course:  Onset of Labor With Vaginal Delivery     34 y.o. yo G1P1001 at 333w6d was admitted in Active Laboron 11/20/2015. Patient had an uncomplicated labor course as follows:  Membrane Rupture Time/Date: 8:35 AM ,11/20/2015   Intrapartum Procedures: Episiotomy: None [1]                                         Lacerations:  2nd degree [3];Perineal [11]  Patient had a delivery of a Viable infant. 11/20/2015  Information for the patient's newborn:  Caroline Payne, Boy Caroline Payne [[id number]][[id number]]  Delivery Method: Vag-Spont    Pateint had an uncomplicated postpartum course.  She is ambulating, tolerating a regular diet, passing flatus, and urinating well. Patient is discharged home in stable condition on No discharge date for patient encounter.Marland Kitchen.    Physical exam  Filed Vitals:   11/21/15 0531 11/21/15 0820 11/21/15 1829 11/22/15 0613  BP: 109/68 105/74 112/70 97/63  Pulse: 84 88 76 72  Temp: 97.7 F (36.5 C) 98.4 F (36.9 C) 98.2 F (36.8 C) 97.8 F (36.6 C)  TempSrc: Oral Oral Oral Oral  Resp: 18 20 18 17   Height:      Weight:      SpO2:       General: alert, cooperative and no distress Lochia: appropriate Uterine Fundus: firm Incision: N/A DVT Evaluation: No evidence of DVT seen on physical exam. Labs: Lab Results  Component Value Date   WBC 24.1* 11/21/2015   HGB  11.2* 11/21/2015   HCT 32.1* 11/21/2015   MCV 92.0 11/21/2015   PLT 183 11/21/2015   No flowsheet data found.  Discharge instruction: per After Visit Summary and "Baby and Me Booklet".  After visit meds:    Medication List    STOP taking these medications        valACYclovir 500 MG tablet  Commonly known as:  VALTREX      TAKE these medications        ibuprofen 600 MG tablet  Commonly known as:  ADVIL,MOTRIN  Take 1 tablet (600 mg total) by mouth every 6 (six) hours as needed.     prenatal multivitamin Tabs tablet  Take 1 tablet by mouth daily at 12 noon.        Diet: routine diet  Activity: Advance as tolerated. Pelvic  rest for 6 weeks.   Outpatient follow up:2 weeks Follow up Appt:Future Appointments Date Time Provider Department Center  11/25/2015 12:00 AM [facility] ROOM [facility] None   Follow up Visit:No Follow-up on file.  Postpartum contraception: Not Discussed  Newborn Data: Live born female  Birth Weight: 7 lb 11 oz (3487 g) APGAR: 8, 9  Baby Feeding: Breast Disposition:home with mother   11/22/2015 Jessee Avers., MD

## 2015-11-25 ENCOUNTER — Inpatient Hospital Stay (HOSPITAL_COMMUNITY): Admission: RE | Admit: 2015-11-25 | Payer: BC Managed Care – PPO | Source: Ambulatory Visit

## 2017-01-19 ENCOUNTER — Other Ambulatory Visit: Payer: Self-pay | Admitting: Obstetrics & Gynecology

## 2017-01-19 ENCOUNTER — Other Ambulatory Visit (HOSPITAL_COMMUNITY)
Admission: RE | Admit: 2017-01-19 | Discharge: 2017-01-19 | Disposition: A | Discharge status: Home / Self Care | Payer: BC Managed Care – PPO | Source: Ambulatory Visit | Attending: Obstetrics & Gynecology | Admitting: Obstetrics & Gynecology

## 2017-01-19 DIAGNOSIS — Z1151 Encounter for screening for human papillomavirus (HPV): Secondary | ICD-10-CM | POA: Diagnosis not present

## 2017-01-19 DIAGNOSIS — Z01419 Encounter for gynecological examination (general) (routine) without abnormal findings: Secondary | ICD-10-CM | POA: Insufficient documentation

## 2017-01-23 LAB — CYTOLOGY - PAP
Diagnosis: NEGATIVE
HPV: NOT DETECTED

## 2020-01-09 ENCOUNTER — Inpatient Hospital Stay (HOSPITAL_COMMUNITY): Payer: BC Managed Care – PPO

## 2020-01-09 ENCOUNTER — Inpatient Hospital Stay (HOSPITAL_COMMUNITY)
Admission: AD | Admit: 2020-01-09 | Discharge: 2020-01-09 | Disposition: A | Discharge status: Home / Self Care | Payer: BC Managed Care – PPO | Attending: Obstetrics & Gynecology | Admitting: Obstetrics & Gynecology

## 2020-01-09 ENCOUNTER — Other Ambulatory Visit: Payer: Self-pay

## 2020-01-09 ENCOUNTER — Encounter (HOSPITAL_COMMUNITY): Payer: Self-pay | Admitting: Obstetrics & Gynecology

## 2020-01-09 DIAGNOSIS — O034 Incomplete spontaneous abortion without complication: Secondary | ICD-10-CM | POA: Diagnosis not present

## 2020-01-09 DIAGNOSIS — O368390 Maternal care for abnormalities of the fetal heart rate or rhythm, unspecified trimester, not applicable or unspecified: Secondary | ICD-10-CM

## 2020-01-09 DIAGNOSIS — Z881 Allergy status to other antibiotic agents status: Secondary | ICD-10-CM | POA: Insufficient documentation

## 2020-01-09 DIAGNOSIS — O200 Threatened abortion: Secondary | ICD-10-CM | POA: Diagnosis present

## 2020-01-09 DIAGNOSIS — Z3A08 8 weeks gestation of pregnancy: Secondary | ICD-10-CM | POA: Diagnosis not present

## 2020-01-09 LAB — URINALYSIS, ROUTINE W REFLEX MICROSCOPIC
Bilirubin Urine: NEGATIVE
Glucose, UA: NEGATIVE mg/dL
Hgb urine dipstick: NEGATIVE
Ketones, ur: 5 mg/dL — AB
Leukocytes,Ua: NEGATIVE
Nitrite: NEGATIVE
Protein, ur: NEGATIVE mg/dL
Specific Gravity, Urine: 1.017 (ref 1.005–1.030)
pH: 8 (ref 5.0–8.0)

## 2020-01-09 LAB — CBC
HCT: 39.8 % (ref 36.0–46.0)
Hemoglobin: 13.6 g/dL (ref 12.0–15.0)
MCH: 31.4 pg (ref 26.0–34.0)
MCHC: 34.2 g/dL (ref 30.0–36.0)
MCV: 91.9 fL (ref 80.0–100.0)
Platelets: 303 10*3/uL (ref 150–400)
RBC: 4.33 MIL/uL (ref 3.87–5.11)
RDW: 11.7 % (ref 11.5–15.5)
WBC: 10.7 10*3/uL — ABNORMAL HIGH (ref 4.0–10.5)
nRBC: 0 % (ref 0.0–0.2)

## 2020-01-09 LAB — POCT PREGNANCY, URINE: Preg Test, Ur: POSITIVE — AB

## 2020-01-09 LAB — HCG, QUANTITATIVE, PREGNANCY: hCG, Beta Chain, Quant, S: 63948 m[IU]/mL — ABNORMAL HIGH (ref <5)

## 2020-01-09 NOTE — Discharge Instructions (Signed)
Return to MAU:  If you have heavy bleeding that soaks through more that 2 pads per hour for an hour or more  If you bleed so much that you feel like you might pass out or you do pass out  If you have significant abdominal pain that is not improved with Tylenol 1000 mg  If you develop a fever > 100.5

## 2020-01-09 NOTE — MAU Provider Note (Signed)
History     CSN: [id number]  Arrival date and time: 01/09/20 9678   First Provider Initiated Contact with Patient 01/09/20 1100      Chief Complaint  Patient presents with  . Threatened Miscarriage   HPI  Ms.  Caroline Payne is a 39 y.o. year old G36P1001 female at [redacted]w[redacted]d weeks gestation who was sent to MAU from [facility]. She reports that she had a TVUS at [redacted] wks gestation and there was a heart beat at that [place]. She was in the office today for her NOB appt. She had a pap test and other lab work. Dr. Nelda Marseille performed a bedside abdominal U/S and was not able to see cardiac activity. She denies any VB or abdominal pain at this time. She is concerned, but "trying not to get emotional because of the unknown right now."  Past Medical History:  Diagnosis Date  . HSV infection     Past Surgical History:  Procedure Laterality Date  . WISDOM TOOTH EXTRACTION      History reviewed. No pertinent family history.  Social History   Tobacco Use  . Smoking status: Never Smoker  . Smokeless tobacco: Never Used  Substance Use Topics  . Alcohol use: No    Alcohol/week: 0.0 standard drinks  . Drug use: No    Allergies:  Allergies  Allergen Reactions  . Erythromycin Other (See Comments)    Medications Prior to Admission  Medication Sig Dispense Refill Last Dose  . acidophilus (RISAQUAD) CAPS capsule Take by mouth daily.   01/09/2020 at Unknown time  . Prenatal Vit-Fe Fumarate-FA (PRENATAL MULTIVITAMIN) TABS tablet Take 1 tablet by mouth daily at 12 noon.   01/09/2020 at Unknown time  . ibuprofen (ADVIL,MOTRIN) 600 MG tablet Take 1 tablet (600 mg total) by mouth every 6 (six) hours as needed. 30 tablet 1     Review of Systems  Constitutional: Negative.   HENT: Negative.   Eyes: Negative.   Respiratory: Negative.   Cardiovascular: Negative.   Gastrointestinal: Negative.   Endocrine: Negative.   Genitourinary: Negative.        No fetal heart tones on BS U/S today in the office   Musculoskeletal: Negative.   Skin: Negative.   Allergic/Immunologic: Negative.   Neurological: Negative.   Hematological: Negative.   Psychiatric/Behavioral: Negative.    Physical Exam   Blood pressure 129/71, pulse 88, temperature 98.4 F (36.9 C), resp. rate 18, height 5\' 2"  (1.575 m), weight 70.8 kg, last menstrual period 11/11/2019, SpO2 99 %, unknown if currently breastfeeding.  Physical Exam  Nursing note and vitals reviewed. Constitutional: She is oriented to person, place, and time. She appears well-developed and well-nourished.  HENT:  Head: Normocephalic and atraumatic.  Eyes: Pupils are equal, round, and reactive to light.  Cardiovascular: Normal rate.  Respiratory: Effort normal.  GI: Soft.  Genitourinary:    Genitourinary Comments: Not indicated - pelvic done in office prior to arrival to MAU   Musculoskeletal:        General: Normal range of motion.     Cervical back: Normal range of motion.  Neurological: She is alert and oriented to person, place, and time.  Skin: Skin is warm and dry.  Psychiatric: She has a normal mood and affect. Her behavior is normal. Judgment and thought content normal.    MAU Course  Procedures  MDM CCUA UPT CBC ABO/Rh -- not drawn; known O POS HCG Wet Prep GC/CT -- pending HIV -- pending OB < 14 wks  [place] with TV  Results for orders placed or performed during the hospital encounter of 01/09/20 (from the past 24 hour(s))  Pregnancy, urine POC     Status: Abnormal   Collection Time: 01/09/20 10:02 AM  Result Value Ref Range   Preg Test, Ur POSITIVE (A) NEGATIVE  CBC     Status: Abnormal   Collection Time: 01/09/20 10:13 AM  Result Value Ref Range   WBC 10.7 (H) 4.0 - 10.5 K/uL   RBC 4.33 3.87 - 5.11 MIL/uL   Hemoglobin 13.6 12.0 - 15.0 g/dL   HCT 81.4 48.1 - 85.6 %   MCV 91.9 80.0 - 100.0 fL   MCH 31.4 26.0 - 34.0 pg   MCHC 34.2 30.0 - 36.0 g/dL   RDW 31.4 97.0 - 26.3 %   Platelets 303 150 - 400 K/uL   nRBC 0.0 0.0 -  0.2 %    US OB LESS THAN 14 WEEKS WITH OB TRANSVAGINAL  Result Date: 01/09/2020 CLINICAL DATA:  39 year old female, physician's office unable to detect fetal heart tones in the 1st trimester of pregnancy. Estimated gestational age by LMP 8 weeks and 3 days. Positive urine pregnancy test. EXAM: OBSTETRIC <14 WK [place] AND TRANSVAGINAL OB US TECHNIQUE: Both transabdominal and transvaginal ultrasound examinations were performed for complete evaluation of the gestation as well as the maternal uterus, adnexal regions, and pelvic cul-de-sac. Transvaginal technique was performed to assess early pregnancy. COMPARISON:  None. FINDINGS: Intrauterine gestational sac: Single Yolk sac:  Visible Embryo:  Visible Cardiac Activity: None detected (image 63, cine series 1 and 2). CRL:  15.5 mm   7 w   6 d Subchorionic hemorrhage:  None visualized. Maternal uterus/adnexae: The left ovary appears normal measuring 1.7 x 2.7 x 1.7 cm. The right ovary is larger and contains a simple appearing cyst measuring 3 cm (image 70). The right ovary is 4.1 x 3.0 x 3.7 cm. Small intramural uterine fibroid measuring 1.3 cm on image 56. No pelvic free fluid. IMPRESSION: 1. IUP with yolk sac and embryo but no fetal cardiac activity detected despite multiple attempts. The findings meet definitive criteria for failed pregnancy. This follows SRU consensus guidelines: Diagnostic Criteria for Nonviable Pregnancy Early in the First Trimester. Macy Mis J Med [id number]. 2. No subchorionic hemorrhage or pelvic free fluid. Small 1.3 centimeter uterine fibroid. Right ovarian simple cyst or corpus luteum cyst. Electronically Signed   By: Odessa Fleming M.D.   On: 01/09/2020 11:03    *TC to Dr. Charlotta Newton @ 1152 - notified of patient's lab & U/S results, tx plan d/c home with expectant management (per pt request) - ok to d/c home, agrees with plan, planning on calling patient Monday 01/12/20 to check on her.  Assessment and Plan  Incomplete miscarriage  - Patient  chooses expectant management - Discussed reasons to return to MAU - Note to be OOW until Tuesday 01/13/20 - Advised that Dr. Charlotta Newton will f/u with her on Monday 01/12/20 - Comfort items given and comfort measures offered - Patient allowed to voice questions and concerns - Patient verbalized an understanding of the plan of care and agrees.     Raelyn Mora, MSN, CNM 01/09/2020, 11:00 AM

## 2020-01-09 NOTE — MAU Note (Signed)
.   Caroline Payne is a 39 y.o. at [redacted]w[redacted]d here in MAU reporting: she was sent from the office for U/S due to physician not seeing FHR on abdominal U/S today. PT had vaginal U/S 2 weeks ago and FHR present.PT denies pain or bleeding LMP: 11/11/19 Onset of complaint: today Pain score: 0 Vitals:   01/09/20 1008  BP: 129/71  Pulse: 88  Resp: 18  Temp: 98.4 F (36.9 C)  SpO2: 99%     FHT: Lab orders placed from triage: UA/UPT

## 2020-05-01 ENCOUNTER — Ambulatory Visit: Payer: BC Managed Care – PPO | Attending: Internal Medicine

## 2020-05-01 DIAGNOSIS — Z23 Encounter for immunization: Secondary | ICD-10-CM

## 2020-05-01 NOTE — Progress Notes (Signed)
   Covid-19 Vaccination Clinic  Name:  Caroline Payne    MRN: [id number] DOB: 1981/07/02  05/01/2020  [name] was observed post Covid-19 immunization for 15 minutes without incident. She was provided with Vaccine Information Sheet and instruction to access the V-Safe system.   [name] was instructed to call 911 with any severe reactions post vaccine: Marland Kitchen Difficulty breathing  . Swelling of face and throat  . A fast heartbeat  . A bad rash all over body  . Dizziness and weakness   Immunizations Administered    Name Date Dose VIS Date Route   Pfizer COVID-19 Vaccine 05/01/2020 11:44 AM 0.3 mL 01/21/2019 Intramuscular   Manufacturer: [employer], [employer]   Lot: [id number]   NDC: [address]-6

## 2020-05-24 ENCOUNTER — Ambulatory Visit: Payer: BC Managed Care – PPO | Attending: Internal Medicine

## 2020-05-24 DIAGNOSIS — Z23 Encounter for immunization: Secondary | ICD-10-CM

## 2020-05-24 NOTE — Progress Notes (Signed)
   Covid-19 Vaccination Clinic  Name:  Caroline Payne    MRN: [id number] DOB: May 20, 1981  05/24/2020  [name] was observed post Covid-19 immunization for 15 minutes without incident. She was provided with Vaccine Information Sheet and instruction to access the V-Safe system.   [name] was instructed to call 911 with any severe reactions post vaccine: Marland Kitchen Difficulty breathing  . Swelling of face and throat  . A fast heartbeat  . A bad rash all over body  . Dizziness and weakness   Immunizations Administered    Name Date Dose VIS Date Route   Pfizer COVID-19 Vaccine 05/24/2020  8:30 AM 0.3 mL 01/21/2019 Intramuscular   Manufacturer: [employer], [employer]   Lot: [id number]   NDC: [address]-9

## 2020-09-08 IMAGING — US US OB < 14 WEEKS - US OB TV
1 series · 15 of 28 positions shown · non-contrast
Comparison: None.

CLINICAL DATA: 38-year-old female, physician's office unable to
detect fetal heart tones in the 1st trimester of pregnancy.
Estimated gestational age by LMP 8 weeks and 3 days. Positive urine
pregnancy test.

EXAM:
OBSTETRIC <14 WK US AND TRANSVAGINAL OB US
TECHNIQUE: Both transabdominal and transvaginal ultrasound examinations were
performed for complete evaluation of the gestation as well as the
maternal uterus, adnexal regions, and pelvic cul-de-sac.
Transvaginal technique was performed to assess early pregnancy.

[Series 1: us ob < 14 weeks - us ob tv · 15 of 71 slices shown]
[im 1/71]
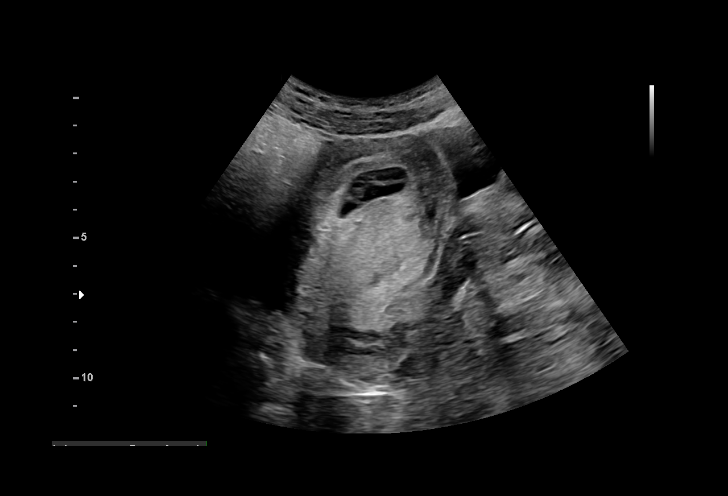
[im 6/71]
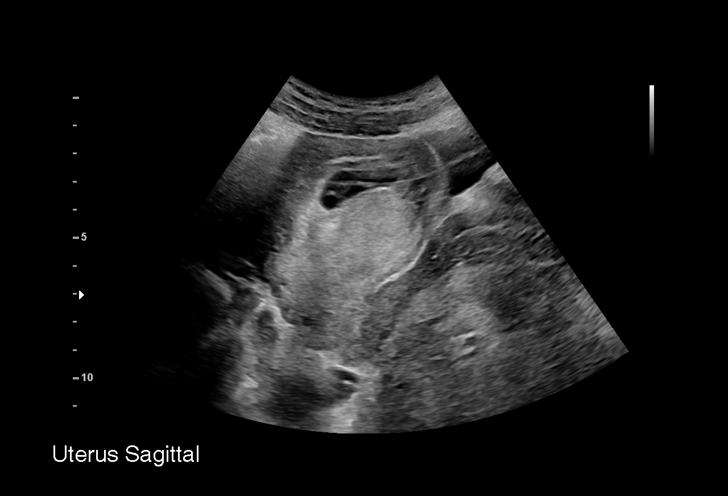
[im 11/71]
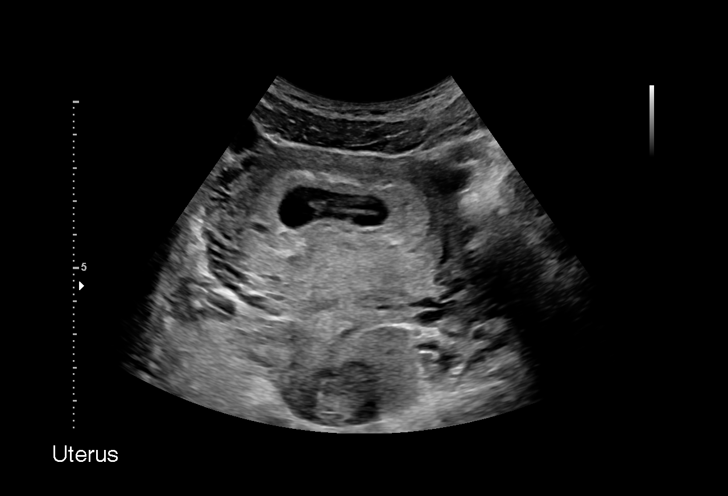
[im 16/71]
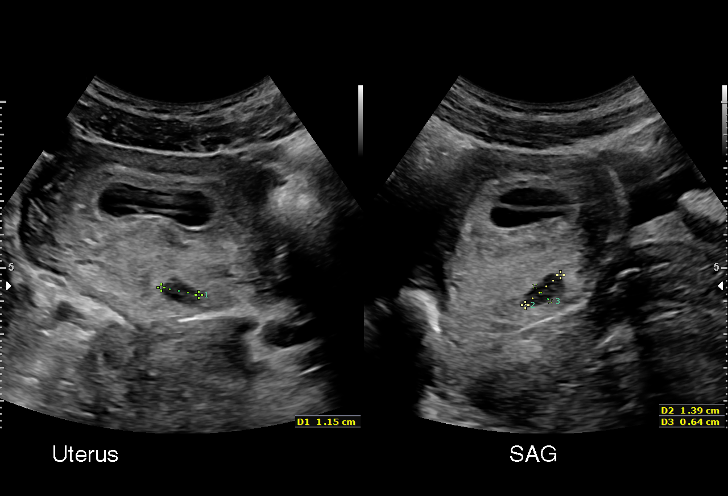
[im 21/71]
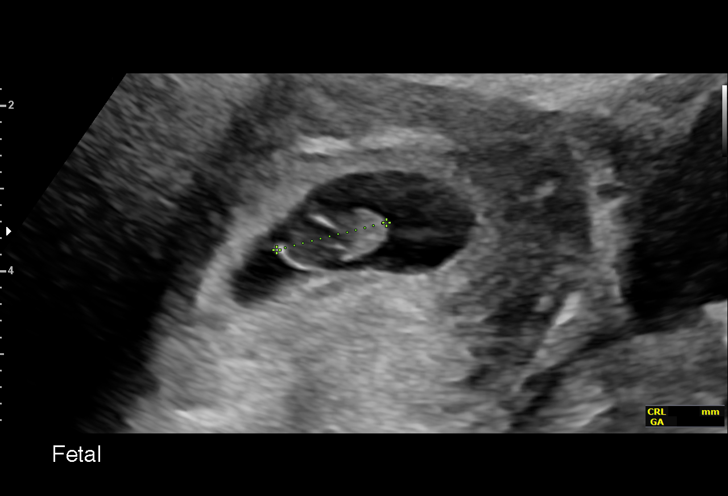
[im 26/71]
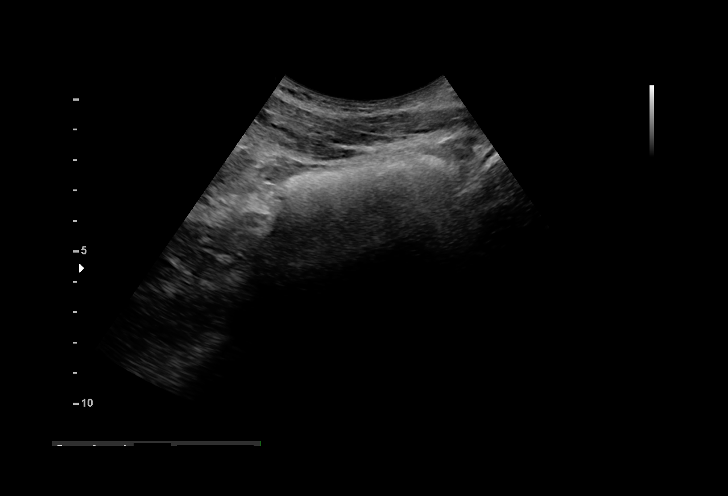
[im 32/71]
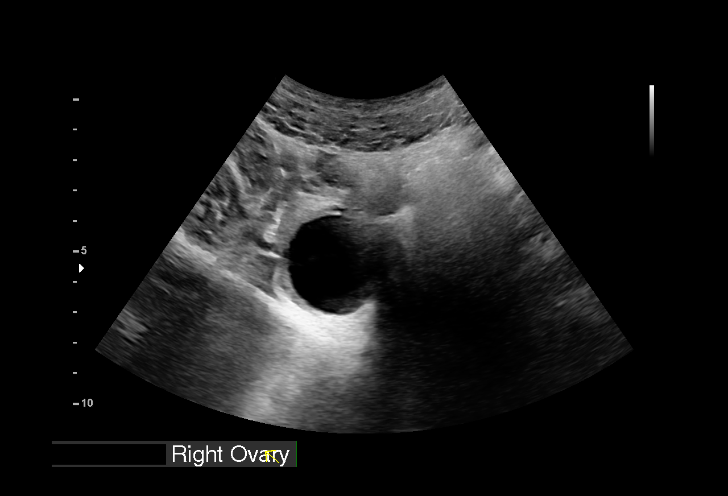
[im 37/71]
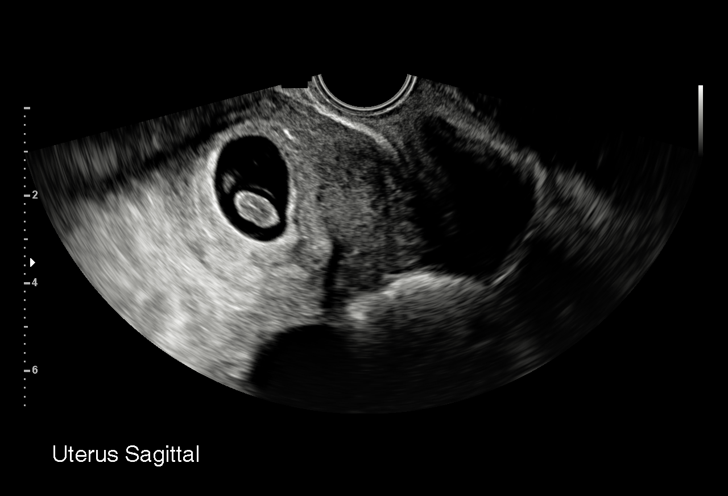
[im 39/71]
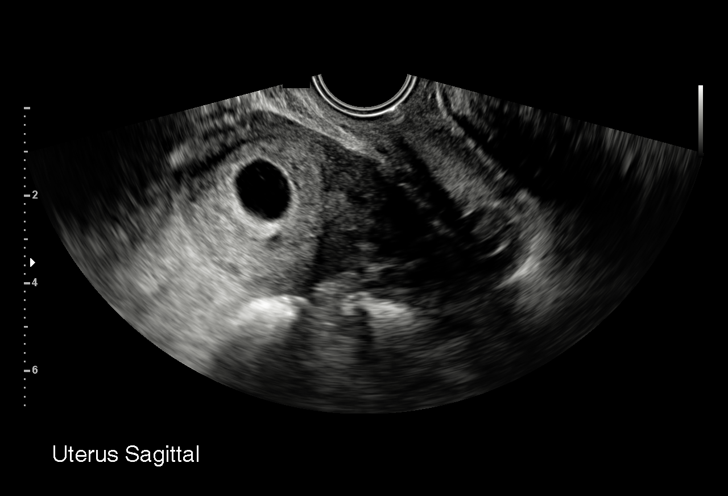
[im 45/71]
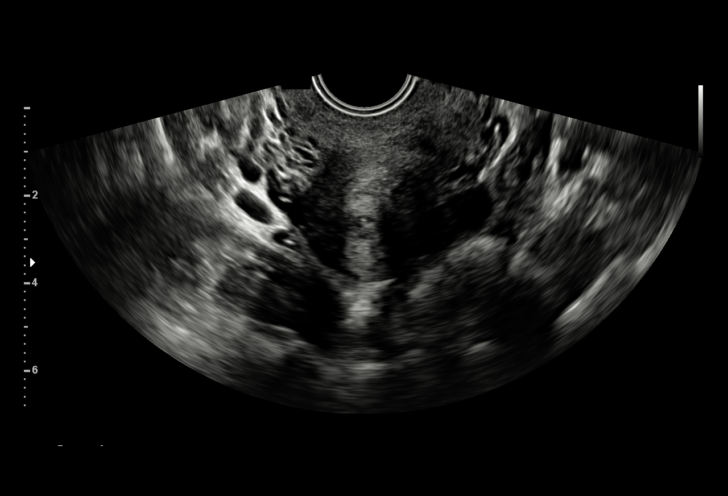
[im 50/71]
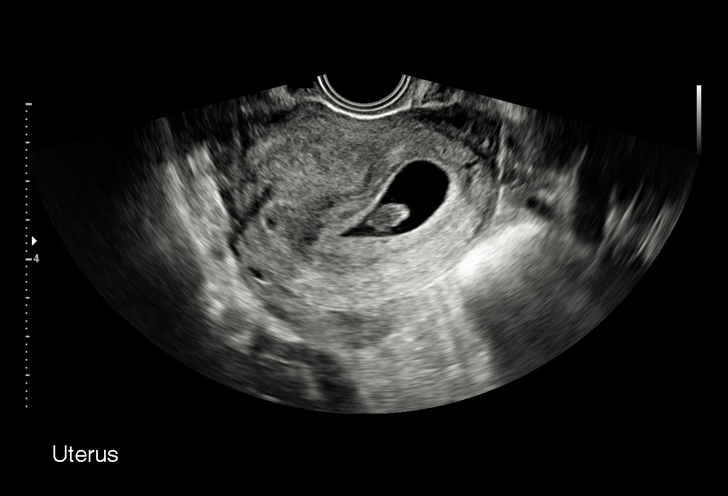
[im 55/71]
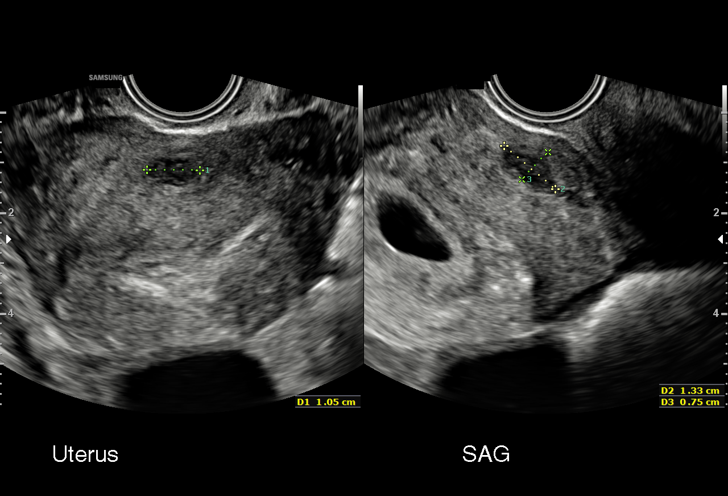
[im 60/71]
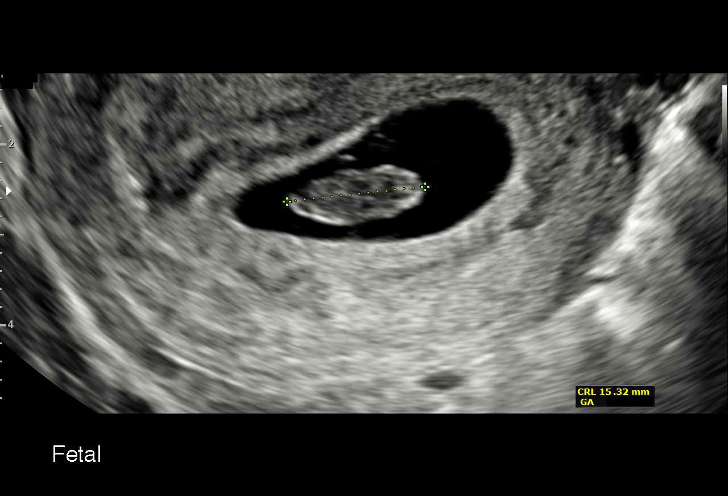
[im 65/71]
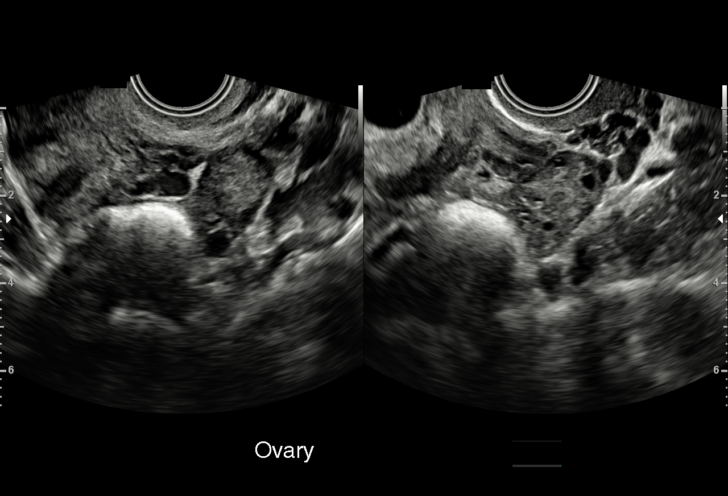
[im 71/71]
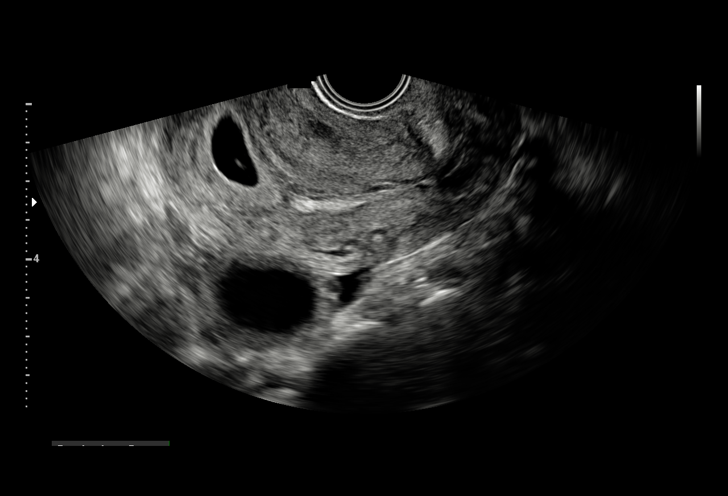

[15 of 28 positions shown; findings below may reference images not displayed]

FINDINGS: Intrauterine gestational sac: Single

Yolk sac:  Visible

Embryo:  Visible

Cardiac Activity: None detected (image 63, cine series 1 and 2).

CRL:  15.5 mm   7 w   6 d

Subchorionic hemorrhage:  None visualized.

Maternal uterus/adnexae: The left ovary appears normal measuring
x 2.7 x 1.7 cm. The right ovary is larger and contains a simple
appearing cyst measuring 3 cm (image 70). The right ovary is 4.1 x
3.0 x 3.7 cm.

Small intramural uterine fibroid measuring 1.3 cm on image 56. No
pelvic free fluid.
IMPRESSION: 1. IUP with yolk sac and embryo but no fetal cardiac activity
detected despite multiple attempts. The findings meet definitive
criteria for failed pregnancy. This follows SRU consensus
guidelines: Diagnostic Criteria for Nonviable Pregnancy Early in the
First Trimester. N Engl J Med 2274;[DATE].

2. No subchorionic hemorrhage or pelvic free fluid. Small
centimeter uterine fibroid. Right ovarian simple cyst or corpus
luteum cyst.
